# Patient Record
Sex: Male | Born: 1970 | Race: White | Hispanic: No | Marital: Married | Smoking: Never smoker
Health system: Southern US, Community
[De-identification: ages and names within clinical notes are randomized; demographics above are authoritative.]

---

## 2000-12-06 ENCOUNTER — Ambulatory Visit (HOSPITAL_BASED_OUTPATIENT_CLINIC_OR_DEPARTMENT_OTHER): Admission: RE | Admit: 2000-12-06 | Discharge: 2000-12-06 | Payer: Self-pay | Admitting: Surgery

## 2003-10-30 ENCOUNTER — Encounter: Admission: RE | Admit: 2003-10-30 | Discharge: 2003-10-30 | Payer: Self-pay | Admitting: Family Medicine

## 2003-11-29 ENCOUNTER — Ambulatory Visit (HOSPITAL_BASED_OUTPATIENT_CLINIC_OR_DEPARTMENT_OTHER): Admission: RE | Admit: 2003-11-29 | Discharge: 2003-11-29 | Payer: Self-pay | Admitting: Orthopedic Surgery

## 2016-04-25 ENCOUNTER — Encounter: Payer: Self-pay | Admitting: Pulmonary Disease

## 2016-04-25 ENCOUNTER — Ambulatory Visit (INDEPENDENT_AMBULATORY_CARE_PROVIDER_SITE_OTHER): Payer: BLUE CROSS/BLUE SHIELD | Admitting: Pulmonary Disease

## 2016-04-25 VITALS — BP 122/82 | HR 68 | Ht 70.0 in | Wt 249.0 lb

## 2016-04-25 DIAGNOSIS — E669 Obesity, unspecified: Secondary | ICD-10-CM | POA: Diagnosis not present

## 2016-04-25 DIAGNOSIS — G4700 Insomnia, unspecified: Secondary | ICD-10-CM | POA: Diagnosis not present

## 2016-04-25 DIAGNOSIS — G4733 Obstructive sleep apnea (adult) (pediatric): Secondary | ICD-10-CM

## 2016-04-25 NOTE — Progress Notes (Signed)
Subjective:    Patient ID: Stephen Moreno, male    DOB: 1971-11-28, 45 y.o.   MRN: [id number]  HPI   This is the case of Stephen Moreno, 45 y.o. Male, who was referred by Dr. Antony Haste in consultation regarding OSA.   As you very well know, patient was dxed with OSA in 2010. Lab study was done at [facility].  Unsure severity. Tried cpap for 2-3 months. Did NOT tolerate cpap.  Allegedlly was allergic to a lot of masks. Face was swollen. No other issues. It did not sound that he was having an allergic reaction. I think it was too much pressure which caused his face to swell up.  Is hypersomnia has persisted.Has snoring, gasping, choking, witnessed apneas. Hypersomnia affects functionality. 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 Can get sleepy in the afternoon. No abnormal behavior and sleep. He has several awakenings during the night, no apparent reason. He takes Ambien at night to keep him sleeping throughout the night.  ESS 10.   Review of Systems  Constitutional: Negative.  Negative for fever and unexpected weight change.  HENT: Negative for congestion, dental problem, ear pain, nosebleeds, postnasal drip, rhinorrhea, sinus pressure, sneezing, sore throat and trouble swallowing.   Eyes: Negative.  Negative for redness and itching.  Respiratory: Negative.  Negative for cough, chest tightness, shortness of breath and wheezing.   Cardiovascular: Negative.  Negative for palpitations and leg swelling.  Gastrointestinal: Negative.  Negative for nausea and vomiting.  Endocrine: Negative.   Genitourinary: Negative.  Negative for dysuria.  Musculoskeletal: Negative.  Negative for joint swelling.  Skin: Negative.  Negative for rash.  Allergic/Immunologic: Negative.   Neurological: Positive for headaches.  Hematological: Negative.  Does not bruise/bleed easily.  Psychiatric/Behavioral: Negative.  Negative for dysphoric mood. The  patient is not nervous/anxious.    No past medical history on file.  No medical issues except for high cholesterol.  No asthma, copd.  (-) DVT, CA No family history on file.  Parents are healthy. Mother had cervical CA.   No past surgical history on file.  (-) surgery.   Social History   Social History  . Marital Status: Married    Spouse Name: N/A  . Number of Children: N/A  . Years of Education: N/A   Occupational History  . Not on file.   Social History Main Topics  . Smoking status: Never Smoker   . Smokeless tobacco: Not on file  . Alcohol Use: Not on file  . Drug Use: Not on file  . Sexual Activity: Not on file   Other Topics Concern  . Not on file   Social History Narrative  . No narrative on file   Lives in [place]. Contractor/build houses.   No Known Allergies   No outpatient prescriptions prior to visit.   No facility-administered medications prior to visit.   Meds ordered this encounter  Medications  . gemfibrozil (LOPID) 600 MG tablet    Sig: Take 1 tablet by mouth 2 (two) times daily.  . pravastatin (PRAVACHOL) 80 MG tablet    Sig: Take 1 tablet by mouth at bedtime.  Marland Kitchen zolpidem (AMBIEN) 10 MG tablet    Sig: Take 1 tablet by mouth at bedtime as needed.  . sildenafil (VIAGRA) 100 MG tablet    Sig: Take 1 tablet by mouth as directed.           Objective:  Physical Exam   Vitals:  Filed Vitals:   04/25/16 1029  BP: 122/82  Pulse: 68  Height: 5\' 10"  (1.778 m)  Weight: 249 lb (112.946 kg)  SpO2: 93%    Constitutional/General:  Pleasant, well-nourished, well-developed, not in any distress,  Comfortably seating.  Well kempt  Body mass index is 35.73 kg/(m^2). Wt Readings from Last 3 Encounters:  04/25/16 249 lb (112.946 kg)    Neck circumference: 19 inches  HEENT: Pupils equal and reactive to light and accommodation. Anicteric sclerae. Normal nasal mucosa.   No oral  lesions,  mouth clear,  oropharynx clear, no postnasal  drip. (-) Oral thrush. No dental caries.  Airway - Mallampati class III. Short stout neck.   Neck: No masses. Midline trachea. No JVD, (-) LAD. (-) bruits appreciated.  Respiratory/Chest: Grossly normal chest. (-) deformity. (-) Accessory muscle use.  Symmetric expansion. (-) Tenderness on palpation.  Resonant on percussion.  Diminished BS on both lower lung zones. (-) wheezing, crackles, rhonchi (-) egophony  Cardiovascular: Regular rate and  rhythm, heart sounds normal, no murmur or gallops, no peripheral edema  Gastrointestinal:  Normal bowel sounds. Soft, non-tender. No hepatosplenomegaly.  (-) masses.   Musculoskeletal:  Normal muscle tone. Normal gait.   Extremities: Grossly normal. (-) clubbing, cyanosis.  (-) edema  Skin: (-) rash,lesions seen.   Neurological/Psychiatric : alert, oriented to time, place, person. Normal mood and affect           Assessment & Plan:  OSA (obstructive sleep apnea) Patient was dxed with OSA in 2010. [facility].  Unsure severity. Tried cpap for 2-3 months. Did NOT tolerate cpap.  Allegedlly was allergic to a lot of masks. Face was swollen. No other issues. It did not sound that he was having an allergic reaction. I think it was too much pressure which caused his face to swell up.  Is hypersomnia has persisted.Has snoring, gasping, choking, witnessed apneas. Hypersomnia affects functionality. 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 Can get sleepy in the afternoon. No abnormal behavior and sleep. He has several awakenings during the night, no apparent reason. He takes Ambien at night to keep him sleeping throughout the night.   1. I wanted to do an inlab study but pt is busy with work.  2. Plan for a HST. 3. He is "allergic" to masks and had a lot of facial swelling in 2010. Plan to try pt on a lower pressure, high EPR, may also try nasal pillows/nasal mask/or cloth  mask.  4. May need an inlab titration study. May need BiPaP.   Insomnia Pt with issues staying asleep likely 2/2 OSA. On ambien. Try to wean off once on cpap.  Try melatonin or suvorexant.   Obesity Weight reduction.     Thank you very much for letting me participate in this patient's care. Please do not hesitate to give me a call if you have any questions or concerns regarding the treatment plan.   Patient will follow up with me in mid August.     Pollie MeyerJ. Angelo A. de Dios, MD 04/25/2016   11:50 AM Pulmonary and Critical Care Medicine [facility] Pager: [phone] Office: [phone], Fax: [phone]

## 2016-04-25 NOTE — Assessment & Plan Note (Signed)
Pt with issues staying asleep likely 2/2 OSA. On ambien. Try to wean off once on cpap.  Try melatonin or suvorexant.

## 2016-04-25 NOTE — Assessment & Plan Note (Addendum)
Patient was dxed with OSA in 2010. [facility].  Unsure severity. Tried cpap for 2-3 months. Did NOT tolerate cpap.  Allegedlly was allergic to a lot of masks. Face was swollen. No other issues. It did not sound that he was having an allergic reaction. I think it was too much pressure which caused his face to swell up.  Is hypersomnia has persisted.Has snoring, gasping, choking, witnessed apneas. Hypersomnia affects functionality. 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 Can get sleepy in the afternoon. No abnormal behavior and sleep. He has several awakenings during the night, no apparent reason. He takes Ambien at night to keep him sleeping throughout the night.   1. I wanted to do an inlab study but pt is busy with work.  2. Plan for a HST. 3. He is "allergic" to masks and had a lot of facial swelling in 2010. Plan to try pt on a lower pressure, high EPR, may also try nasal pillows/nasal mask/or cloth mask.  4. May need an inlab titration study. May need BiPaP.

## 2016-04-25 NOTE — Patient Instructions (Signed)
1. We will schedule you for a home sleep study. 2. We will order you an autocpap 5-15 cm H2o after the study. 2. Give us a call if you are having issues with cpap.  Return to clinic in August.

## 2016-04-25 NOTE — Assessment & Plan Note (Signed)
Weight reduction 

## 2016-05-17 DIAGNOSIS — G4733 Obstructive sleep apnea (adult) (pediatric): Secondary | ICD-10-CM | POA: Diagnosis not present

## 2016-05-31 ENCOUNTER — Telehealth: Payer: Self-pay | Admitting: Pulmonary Disease

## 2016-05-31 DIAGNOSIS — G4733 Obstructive sleep apnea (adult) (pediatric): Secondary | ICD-10-CM

## 2016-05-31 NOTE — Telephone Encounter (Signed)
LMTCB for the pt  [facility]

## 2016-05-31 NOTE — Telephone Encounter (Signed)
See other msg dated today 

## 2016-05-31 NOTE — Telephone Encounter (Signed)
  Please call the pt and tell the pt the HOME SLEEP STUDY  showed OSA / did not show OSA.   Pt stops breathing 25   times an hour.   Please order autoCPAP 5-15 cm H2O. Patient will need a mask fitting session. Patient will need a 1 month download.   Patient needs to be seen by me (as much as possible) or any of the NPs/APPs  6-8 weeks after obtaining the cpap machine. Let me know if you receive this.   Thanks!   J. Alexis FrockAngelo A de Dios, MD 05/31/2016, 4:14 PM

## 2016-06-01 ENCOUNTER — Other Ambulatory Visit: Payer: Self-pay | Admitting: *Deleted

## 2016-06-01 DIAGNOSIS — G4733 Obstructive sleep apnea (adult) (pediatric): Secondary | ICD-10-CM

## 2016-06-01 NOTE — Telephone Encounter (Signed)
Pt returning call and can be reached @ same.[name] ° °

## 2016-06-01 NOTE — Telephone Encounter (Signed)
Spoke with pt. He is aware of results. Order has been placed. Nothing further was needed.

## 2016-06-07 ENCOUNTER — Telehealth: Payer: Self-pay | Admitting: Pulmonary Disease

## 2016-06-07 NOTE — Telephone Encounter (Signed)
Spoke with pt, I advised that [facility] per [facility].  Pt then became upset that the [facility] had changed from [facility] without anyone notifying him.  I apologized for the change of DME companies and offered to give [facility] the [facility] so he could follow up on the order of his cpap order, pt stated "I will find the number and hopefully he gets the cpap before I have a heart attack" and hung up the phone.  ATC pt back X2, line rang twice each time and then line was ended.  Will close encounter.

## 2016-06-28 ENCOUNTER — Telehealth: Payer: Self-pay | Admitting: Pulmonary Disease

## 2016-06-28 NOTE — Telephone Encounter (Signed)
Per [facility], pt does not have finances to pay for cpap. He wants to hold off on cpap for now.   AD

## 2016-08-06 ENCOUNTER — Ambulatory Visit (INDEPENDENT_AMBULATORY_CARE_PROVIDER_SITE_OTHER): Payer: BLUE CROSS/BLUE SHIELD | Admitting: Pulmonary Disease

## 2016-08-06 ENCOUNTER — Encounter: Payer: Self-pay | Admitting: Pulmonary Disease

## 2016-08-06 DIAGNOSIS — G4733 Obstructive sleep apnea (adult) (pediatric): Secondary | ICD-10-CM

## 2016-08-06 DIAGNOSIS — E669 Obesity, unspecified: Secondary | ICD-10-CM | POA: Diagnosis not present

## 2016-08-06 NOTE — Assessment & Plan Note (Addendum)
Patient was dxed with OSA in 2010. [facility].  Unsure severity. Tried cpap for 2-3 months. Did NOT tolerate cpap.  Allegedlly was allergic to a lot of masks. Face was swollen. No other issues. It did not sound that he was having an allergic reaction. I think it was too much pressure which caused his face to swell up.  Patient with worsening hypersomnia affecting functionality at work. 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  Had a home sleep study in May 2017. AHI of 25. Started auto CPAP on 07/07/2016. Feels better using it. More energy. Less sleepiness. Significantly improved. This time around, no mask issues. He currently has nasal pillows. Download the last month, 90%, AHI1.   We extensively discussed the importance of treating OSA and the need to use PAP therapy.   Continue with autocpap 5-15 cm water.    Patient was instructed to have mask, tubings, filter, reservoir cleaned at least once a week with soapy water.  Patient was instructed to call the office if he/she is having issues with the PAP device.    I advised patient to obtain sufficient amount of sleep --  7 to 8 hours at least in a 24 hr period.  Patient was advised to follow good sleep hygiene.  Patient was advised NOT to engage in activities requiring concentration and/or vigilance if he/she is and  sleepy.  Patient is NOT to drive if he/she is sleepy.

## 2016-08-06 NOTE — Assessment & Plan Note (Signed)
Weight reduction 

## 2016-08-06 NOTE — Patient Instructions (Signed)
  It was a pleasure taking care of you today!  Continue using your CPAP machine.   Please make sure you use your CPAP device everytime you sleep.  We will monitor the usage of your machine per your insurance requirement.  Your insurance company may take the machine from you if you are not using it regularly.   Please clean the mask, tubings, filter, water reservoir with soapy water every week.  Please use distilled water for the water reservoir.   Please call the office or your machine provider (DME company) if you are having issues with the device.   Return to clinic in 1 year    

## 2016-08-06 NOTE — Progress Notes (Signed)
Subjective:    Patient ID: Stephen Moreno, male    DOB: [birth year], 45 y.o.   MRN: [id number]  HPI   This is the case of Stephen Moreno, 45 y.o. Male, who was referred by Dr. Antony HasteMichael Badger in consultation regarding OSA.   As you very well know, patient was dxed with OSA in 2010. [facility].  Unsure severity. Tried cpap for 2-3 months. Did NOT tolerate cpap.  Allegedlly was allergic to a lot of masks. Face was swollen. No other issues. It did not sound that he was having an allergic reaction. I think it was too much pressure which caused his face to swell up.  Is hypersomnia has persisted.Has snoring, gasping, choking, witnessed apneas. Hypersomnia affects functionality. 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 Can get sleepy in the afternoon. No abnormal behavior and sleep. He has several awakenings during the night, no apparent reason. He takes Ambien at night to keep him sleeping throughout the night.  ESS 10.    ROV 08/06/16 Patient returns to the office as follow-up after starting CPAP. He had a home sleep study which showed his AHI was 25. He had issues with a start up eventually starting on July 15. Download the last month, 90% compliance, AHI of 1.3. Uses CPAP. Feels better using it. More energy. Less sleepiness. No issues. Significantly improved.  Review of Systems  Constitutional: Negative.  Negative for fever and unexpected weight change.  HENT: Negative for congestion, dental problem, ear pain, nosebleeds, postnasal drip, rhinorrhea, sinus pressure, sneezing, sore throat and trouble swallowing.   Eyes: Negative.  Negative for redness and itching.  Respiratory: Negative.  Negative for cough, chest tightness, shortness of breath and wheezing.   Cardiovascular: Negative.  Negative for palpitations and leg swelling.  Gastrointestinal: Negative.  Negative for nausea and vomiting.  Endocrine: Negative.     Genitourinary: Negative.  Negative for dysuria.  Musculoskeletal: Negative.  Negative for joint swelling.  Skin: Negative.  Negative for rash.  Allergic/Immunologic: Negative.   Neurological: Positive for headaches.  Hematological: Negative.  Does not bruise/bleed easily.  Psychiatric/Behavioral: Negative.  Negative for dysphoric mood. The patient is not nervous/anxious.       Objective:   Physical Exam   Vitals:  Vitals:   08/06/16 1606  BP: 118/84  Pulse: 77  SpO2: 97%  Weight: 253 lb (114.8 kg)  Height: 5\' 10"  (1.778 m)    Constitutional/General:  Pleasant, well-nourished, well-developed, not in any distress,  Comfortably seating.  Well kempt  Body mass index is 36.3 kg/m. Wt Readings from Last 3 Encounters:  08/06/16 253 lb (114.8 kg)  04/25/16 249 lb (112.9 kg)    Neck circumference: 19 inches  HEENT: Pupils equal and reactive to light and accommodation. Anicteric sclerae. Normal nasal mucosa.   No oral  lesions,  mouth clear,  oropharynx clear, no postnasal drip. (-) Oral thrush. No dental caries.  Airway - Mallampati class III. Short stout neck.   Neck: No masses. Midline trachea. No JVD, (-) LAD. (-) bruits appreciated.  Respiratory/Chest: Grossly normal chest. (-) deformity. (-) Accessory muscle use.  Symmetric expansion. (-) Tenderness on palpation.  Resonant on percussion.  Diminished BS on both lower lung zones. (-) wheezing, crackles, rhonchi (-) egophony  Cardiovascular: Regular rate and  rhythm, heart sounds normal, no murmur or gallops, no peripheral edema  Gastrointestinal:  Normal bowel sounds. Soft, non-tender. No hepatosplenomegaly.  (-)  masses.   Musculoskeletal:  Normal muscle tone. Normal gait.   Extremities: Grossly normal. (-) clubbing, cyanosis.  (-) edema  Skin: (-) rash,lesions seen.   Neurological/Psychiatric : alert, oriented to time, place, person. Normal mood and affect           Assessment & Plan:  OSA  (obstructive sleep apnea) Patient was dxed with OSA in 2010. [facility].  Unsure severity. Tried cpap for 2-3 months. Did NOT tolerate cpap.  Allegedlly was allergic to a lot of masks. Face was swollen. No other issues. It did not sound that he was having an allergic reaction. I think it was too much pressure which caused his face to swell up.  Patient with worsening hypersomnia affecting functionality at work. 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  Had a home sleep study in May 2017. AHI of 25. Started auto CPAP on 07/07/2016. Feels better using it. More energy. Less sleepiness. Significantly improved. This time around, no mask issues. He currently has nasal pillows. Download the last month, 90%, AHI1.   We extensively discussed the importance of treating OSA and the need to use PAP therapy.   Continue with autocpap 5-15 cm water.    Patient was instructed to have mask, tubings, filter, reservoir cleaned at least once a week with soapy water.  Patient was instructed to call the office if he/she is having issues with the PAP device.    I advised patient to obtain sufficient amount of sleep --  7 to 8 hours at least in a 24 hr period.  Patient was advised to follow good sleep hygiene.  Patient was advised NOT to engage in activities requiring concentration and/or vigilance if he/she is and  sleepy.  Patient is NOT to drive if he/she is sleepy.    Obesity Weight reduction.   Return to clinic in 1 year. Sooner if with issues.        Pollie MeyerJ. Angelo A. de Dios, MD 08/06/2016   4:34 PM Pulmonary and Critical Care Medicine [facility] Pager: [phone] Office: [phone], Fax: [phone]

## 2016-09-13 ENCOUNTER — Telehealth: Payer: Self-pay | Admitting: Pulmonary Disease

## 2016-09-13 NOTE — Telephone Encounter (Signed)
   Sheena:   CPAP DL last month : 45%90%, AHI 1.4.  Cont autocpap 5-15 cm water.  pls tell pt he is doing well.   Pollie MeyerJ. Angelo A de Dios, MD 09/13/2016, 9:14 AM [facility] Pager [phone] After 3 pm or if no answer, call [phone]

## 2016-09-17 NOTE — Telephone Encounter (Signed)
LMTCB

## 2016-09-27 NOTE — Telephone Encounter (Signed)
LMTCB

## 2016-10-04 NOTE — Telephone Encounter (Signed)
Pt notified of cpap DL results per Dr Christene Slatese Dios.  PT verbalized understanding.

## 2016-10-04 NOTE — Telephone Encounter (Signed)
3rd attempt to contact pt. LMTCB. Closing encounter per protocol.

## 2016-10-10 ENCOUNTER — Encounter: Payer: Self-pay | Admitting: Pulmonary Disease

## 2018-02-16 ENCOUNTER — Emergency Department (HOSPITAL_BASED_OUTPATIENT_CLINIC_OR_DEPARTMENT_OTHER): Payer: BLUE CROSS/BLUE SHIELD

## 2018-02-16 ENCOUNTER — Emergency Department (HOSPITAL_BASED_OUTPATIENT_CLINIC_OR_DEPARTMENT_OTHER)
Admission: EM | Admit: 2018-02-16 | Discharge: 2018-02-16 | Disposition: A | Discharge status: Home / Self Care | Payer: BLUE CROSS/BLUE SHIELD | Attending: Emergency Medicine | Admitting: Emergency Medicine

## 2018-02-16 ENCOUNTER — Other Ambulatory Visit: Payer: Self-pay

## 2018-02-16 ENCOUNTER — Encounter (HOSPITAL_BASED_OUTPATIENT_CLINIC_OR_DEPARTMENT_OTHER): Payer: Self-pay | Admitting: Emergency Medicine

## 2018-02-16 DIAGNOSIS — R109 Unspecified abdominal pain: Secondary | ICD-10-CM | POA: Diagnosis not present

## 2018-02-16 DIAGNOSIS — W11XXXA Fall on and from ladder, initial encounter: Secondary | ICD-10-CM | POA: Diagnosis not present

## 2018-02-16 DIAGNOSIS — Y998 Other external cause status: Secondary | ICD-10-CM | POA: Insufficient documentation

## 2018-02-16 DIAGNOSIS — R079 Chest pain, unspecified: Secondary | ICD-10-CM | POA: Diagnosis not present

## 2018-02-16 DIAGNOSIS — Z7902 Long term (current) use of antithrombotics/antiplatelets: Secondary | ICD-10-CM | POA: Insufficient documentation

## 2018-02-16 DIAGNOSIS — S42102A Fracture of unspecified part of scapula, left shoulder, initial encounter for closed fracture: Secondary | ICD-10-CM | POA: Diagnosis not present

## 2018-02-16 DIAGNOSIS — R102 Pelvic and perineal pain: Secondary | ICD-10-CM | POA: Insufficient documentation

## 2018-02-16 DIAGNOSIS — Z79899 Other long term (current) drug therapy: Secondary | ICD-10-CM | POA: Diagnosis not present

## 2018-02-16 DIAGNOSIS — S0181XA Laceration without foreign body of other part of head, initial encounter: Secondary | ICD-10-CM | POA: Diagnosis not present

## 2018-02-16 DIAGNOSIS — S0990XA Unspecified injury of head, initial encounter: Secondary | ICD-10-CM | POA: Diagnosis not present

## 2018-02-16 DIAGNOSIS — Z23 Encounter for immunization: Secondary | ICD-10-CM | POA: Insufficient documentation

## 2018-02-16 DIAGNOSIS — S4992XA Unspecified injury of left shoulder and upper arm, initial encounter: Secondary | ICD-10-CM | POA: Diagnosis present

## 2018-02-16 DIAGNOSIS — Y929 Unspecified place or not applicable: Secondary | ICD-10-CM | POA: Insufficient documentation

## 2018-02-16 DIAGNOSIS — Y9389 Activity, other specified: Secondary | ICD-10-CM | POA: Diagnosis not present

## 2018-02-16 LAB — BASIC METABOLIC PANEL
Anion gap: 7 (ref 5–15)
BUN: 15 mg/dL (ref 6–20)
CO2: 25 mmol/L (ref 22–32)
Calcium: 8.5 mg/dL — ABNORMAL LOW (ref 8.9–10.3)
Chloride: 104 mmol/L (ref 101–111)
Creatinine, Ser: 1.05 mg/dL (ref 0.61–1.24)
GFR calc Af Amer: >60 mL/min (ref >60)
GFR calc non Af Amer: >60 mL/min (ref >60)
Glucose, Bld: 124 mg/dL — ABNORMAL HIGH (ref 65–99)
Potassium: 3.9 mmol/L (ref 3.5–5.1)
Sodium: 136 mmol/L (ref 135–145)

## 2018-02-16 MED ORDER — OXYCODONE HCL 5 MG PO TABS: 5.0000 mg | ORAL_TABLET | ORAL | 0 refills | Status: AC | PRN

## 2018-02-16 MED ORDER — BACITRACIN ZINC 500 UNIT/GM EX OINT
TOPICAL_OINTMENT | Freq: Two times a day (BID) | CUTANEOUS | Status: DC
  Administered 2018-02-16: 1 via TOPICAL

## 2018-02-16 MED ORDER — HYDROMORPHONE HCL 1 MG/ML IJ SOLN
1.0000 mg | Freq: Once | INTRAMUSCULAR | Status: AC
  Administered 2018-02-16: 1 mg via INTRAVENOUS
  Filled 2018-02-16: qty 1

## 2018-02-16 MED ORDER — IOPAMIDOL (ISOVUE-300) INJECTION 61%
100.0000 mL | Freq: Once | INTRAVENOUS | Status: AC | PRN
  Administered 2018-02-16: 100 mL via INTRAVENOUS

## 2018-02-16 MED ORDER — SODIUM CHLORIDE 0.9 % IV BOLUS (SEPSIS)
1000.0000 mL | Freq: Once | INTRAVENOUS | Status: AC
  Administered 2018-02-16: 1000 mL via INTRAVENOUS

## 2018-02-16 MED ORDER — TETANUS-DIPHTH-ACELL PERTUSSIS 5-2.5-18.5 LF-MCG/0.5 IM SUSP
0.5000 mL | Freq: Once | INTRAMUSCULAR | Status: AC
  Administered 2018-02-16: 0.5 mL via INTRAMUSCULAR
  Filled 2018-02-16: qty 0.5

## 2018-02-16 MED ORDER — MORPHINE SULFATE (PF) 4 MG/ML IV SOLN
4.0000 mg | Freq: Once | INTRAVENOUS | Status: AC
  Administered 2018-02-16: 4 mg via INTRAVENOUS
  Filled 2018-02-16: qty 1

## 2018-02-16 NOTE — ED Notes (Signed)
Assisted pt to car in wheelchair. Pt and wife voiced understanding of d/c instructions.

## 2018-02-16 NOTE — ED Notes (Signed)
Alert, NAD, calm, interactive, resps e/u, speaking in clear complete sentences, no dyspnea noted, skin W&D, VSS, c/o L shoudler pain, (denies: sob, nausea, dizziness or visual changes). [facility]

## 2018-02-16 NOTE — ED Notes (Signed)
PMS intact before and after. Pt tolerated well. All questions answered. 

## 2018-02-16 NOTE — ED Notes (Signed)
Patient transported to CT and xray by wheelchair. Pt alert and oriented. [facility]

## 2018-02-16 NOTE — ED Notes (Signed)
Scalp abrasion cleansed and bacitracin applied. Pt given ice pack. Wife given something to drink.

## 2018-02-16 NOTE — ED Notes (Signed)
No changes. Alert, NAD, calm, interactive, no dyspnea, VSS, [facility] States, "pain worse than before CT".

## 2018-02-16 NOTE — ED Triage Notes (Signed)
Pt fell over 10 feet off of a ladder. C/o L shoulder pain, denies neck or back pain, denies LOC.

## 2018-02-16 NOTE — Discharge Instructions (Signed)
Roxi, tylenol, and motrin as needed for pain. Ice affected area (see instructions below).  Please call the orthopedic physician listed today or first thing in the morning to schedule a follow up appointment.   Fractures generally take 4-6 weeks to heal. It is very important to keep your splint dry until your follow up with the orthopedic doctor and a cast can be applied. You may place a plastic bag around the extremity with the splint while bathing to keep it dry. Also try to sleep with the extremity elevated for the next several nights to decrease swelling. Check the fingertips and toes several times per day to make sure they are not cold, pale, or blue. If this is the case, the splint may be too tight and should return to the ER, your regular doctor or the orthopedist for recheck. Return to the ER for new or worsening symptoms, any additional concerns.   COLD THERAPY DIRECTIONS:  Ice or gel packs can be used to reduce both pain and swelling. Ice is the most helpful within the first 24 to 48 hours after an injury or flareup from overusing a muscle or joint.  Ice is effective, has very few side effects, and is safe for most people to use.   If you expose your skin to cold temperatures for too long or without the proper protection, you can damage your skin or nerves. Watch for signs of skin damage due to cold.   HOME CARE INSTRUCTIONS  Follow these tips to use ice and cold packs safely.  Place a dry or damp towel between the ice and skin. A damp towel will cool the skin more quickly, so you may need to shorten the time that the ice is used.  For a more rapid response, add gentle compression to the ice.  Ice for no more than 10 to 20 minutes at a time. The bonier the area you are icing, the less time it will take to get the benefits of ice.  Check your skin after 5 minutes to make sure there are no signs of a poor response to cold or skin damage.  Rest 20 minutes or more in between uses.  Once your  skin is numb, you can end your treatment. You can test numbness by very lightly touching your skin. The touch should be so light that you do not see the skin dimple from the pressure of your fingertip. When using ice, most people will feel these normal sensations in this order: cold, burning, aching, and numbness.

## 2018-02-16 NOTE — ED Notes (Signed)
ED PA at [facility] 

## 2018-02-17 NOTE — ED Provider Notes (Signed)
[facility] EMERGENCY DEPARTMENT Provider Note   CSN: [id number] Arrival date & time: 02/16/18  1753     History   Chief Complaint Chief Complaint  Patient presents with  . Fall    HPI Stephen Moreno is a 47 y.o. male.  HPI 47 year old Caucasian male past medical history significant for obesity and sleep apnea presents the emergency department today with complaints of left shoulder pain after an approximately 10-15 foot fall from a ladder.  Patient states that he was on top of the 10 foot plus ladder when he lost his balance falling backwards landing onto his left shoulder.  States that the impact was to his left shoulder.  He does report that the latter then scraped the top of his head causing a small laceration.  Patient has not taken anything for the pain prior to arrival.  Range of motion and palpation makes the pain worse.  Patient denies any associated LOC.  Denies any associated neck pain, back pain, chest pain, abdominal pain, paresthesias, weakness.  Patient is unsure of his last tetanus shot.  Holding still improves the pain.  Patient has been ambulatory since the event. History reviewed. No pertinent past medical history.  Patient Active Problem List   Diagnosis Date Noted  . OSA (obstructive sleep apnea) 04/25/2016  . Insomnia 04/25/2016  . Obesity 04/25/2016    History reviewed. No pertinent surgical history.     Home Medications    Prior to Admission medications   Medication Sig Start Date End Date Taking? Authorizing Provider  gemfibrozil (LOPID) 600 MG tablet Take 1 tablet by mouth 2 (two) times daily. 02/28/16   [provider]  oxyCODONE (ROXICODONE) 5 MG immediate release tablet Take 1 tablet (5 mg total) by mouth every 4 (four) hours as needed for severe pain. 02/16/18   Rise Mu, PA-C  pravastatin (PRAVACHOL) 80 MG tablet Take 1 tablet by mouth at bedtime. 02/28/16   [provider]  sildenafil (VIAGRA) 100 MG tablet  Take 1 tablet by mouth as directed. 02/28/16   [provider]  zolpidem (AMBIEN) 10 MG tablet Take 1 tablet by mouth at bedtime as needed. 02/28/16   [provider]    Family History No family history on file.  Social History Social History   Tobacco Use  . Smoking status: Never Smoker  . Smokeless tobacco: Never Used  Substance Use Topics  . Alcohol use: Not on file  . Drug use: Not on file     Allergies   Patient has no known allergies.   Review of Systems Review of Systems  Constitutional: Negative for chills and fever.  Eyes: Negative for visual disturbance.  Respiratory: Negative for shortness of breath.   Cardiovascular: Negative for chest pain.  Gastrointestinal: Negative for abdominal pain, nausea and vomiting.  Musculoskeletal: Positive for arthralgias, gait problem, joint swelling and myalgias. Negative for back pain, neck pain and neck stiffness.  Skin: Positive for wound. Negative for color change.  Neurological: Negative for dizziness, syncope, weakness, light-headedness, numbness and headaches.     Physical Exam Updated Vital Signs BP (!) 142/83   Pulse 90   Temp 98.1 F (36.7 C) (Oral)   Resp 18   Ht 5\' 10"  (1.778 m)   Wt 99.8 kg (220 lb)   SpO2 98%   BMI 31.57 kg/m   Physical Exam  HENT:  Head:     Physical Exam  Constitutional: Pt is oriented to person, place, and time.  Appears well-developed and well-nourished. No distress.  HENT:  Head: Normocephalic.  Patient has small abrasion to the to the right frontal bone.  No open wound.  Bleeding controlled.  No significant signs of debris.  No foreign body noted.  No skull depression noted.  No raccoon eyes or battle sign. Ears: No bilateral hemotympanum. Nose: Nose normal. No septal hematoma. Mouth/Throat: Uvula is midline, oropharynx is clear and moist and mucous membranes are normal.  Eyes: Conjunctivae and EOM are normal. Pupils are equal, round, and reactive to light.    Neck: No spinous process tenderness and no muscular tenderness present. No rigidity. Normal range of motion present.  Full ROM without pain No midline cervical tenderness No crepitus, deformity or step-offs  No paraspinal tenderness  Cardiovascular: Normal rate, regular rhythm and intact distal pulses.   Pulses:      Radial pulses are 2+ on the right side, and 2+ on the left side.       Dorsalis pedis pulses are 2+ on the right side, and 2+ on the left side.       Posterior tibial pulses are 2+ on the right side, and 2+ on the left side.  Pulmonary/Chest: Effort normal and breath sounds normal. No accessory muscle usage. No respiratory distress. No decreased breath sounds. No wheezes. No rhonchi. No rales. Exhibits no tenderness and no bony tenderness.  No flail segment, crepitus or deformity Equal chest expansion  Abdominal: Soft. Normal appearance and bowel sounds are normal. There is no tenderness. There is no rigidity, no guarding and no CVA tenderness.  Abd soft and nontender  Musculoskeletal: Normal range of motion.       Thoracic back: Exhibits normal range of motion.       Lumbar back: Exhibits normal range of motion.  Full range of motion of the T-spine and L-spine No tenderness to palpation of the spinous processes of the T-spine or L-spine No crepitus, deformity or step-offs No  tenderness to palpation of the paraspinous muscles of the L-spine  With significant tenderness to the left shoulder.  No obvious deformity noted.  No ecchymosis or edema.  There is no abrasions.  Patient has limited range of motion left shoulder due to pain.  Radial pulses are 2+ bilaterally.  Sensation intact.  Brisk cap refill.  Full range of motion left elbow and left wrist without pain.  Axillary nerve sensation intact.  Patient does have good muscle tone. Lymphadenopathy:    Pt has no cervical adenopathy.  Neurological: Pt is alert and oriented to person, place, and time. Normal reflexes. No cranial  nerve deficit. GCS eye subscore is 4. GCS verbal subscore is 5. GCS motor subscore is 6.  Reflex Scores:      Bicep reflexes are 2+ on the right side and 2+ on the left side.      Brachioradialis reflexes are 2+ on the right side and 2+ on the left side.      Patellar reflexes are 2+ on the right side and 2+ on the left side.      Achilles reflexes are 2+ on the right side and 2+ on the left side. Speech is clear and goal oriented, follows commands Normal 5/5 strength in upper and lower extremities bilaterally including dorsiflexion and plantar flexion, strong and equal grip strength Sensation normal to light and sharp touch Moves extremities without ataxia, coordination intact Normal gait and balance No Clonus  Skin: Skin is warm and dry. No rash noted. Pt is not  diaphoretic. No erythema.  Psychiatric: Normal mood and affect.  Nursing note and vitals reviewed.     ED Treatments / Results  Labs (all labs ordered are listed, but only abnormal results are displayed) Labs Reviewed  BASIC METABOLIC PANEL - Abnormal; Notable for the following components:      Result Value   Glucose, Bld 124 (*)    Calcium 8.5 (*)    All other components within normal limits    EKG  EKG Interpretation None       Radiology Dg Chest 2 View  Result Date: 02/16/2018 CLINICAL DATA:  LEFT chest pain following follow-up ladder today. Initial encounter. EXAM: CHEST  2 VIEW COMPARISON:  None. FINDINGS: The cardiomediastinal silhouette is unremarkable. There is no evidence of focal airspace disease, pulmonary edema, suspicious pulmonary nodule/mass, pleural effusion, or pneumothorax. A fracture of the UPPER LEFT scapula is noted. IMPRESSION: 1. LEFT scapular fracture. Recommend dedicated views for further characterization. 2. No evidence of acute cardiopulmonary disease. Electronically Signed   By: Harmon PierJeffrey  Hu M.D.   On: 02/16/2018 19:09   Ct Head Wo Contrast  Result Date: 02/16/2018 CLINICAL DATA:  Fall  off ladder. EXAM: CT HEAD WITHOUT CONTRAST CT CERVICAL SPINE WITHOUT CONTRAST TECHNIQUE: Multidetector CT imaging of the head and cervical spine was performed following the standard protocol without intravenous contrast. Multiplanar CT image reconstructions of the cervical spine were also generated. COMPARISON:  No acute intracranial abnormality. Specifically, no hemorrhage, hydrocephalus, mass lesion, acute infarction, or significant intracranial injury. FINDINGS: CT HEAD FINDINGS Brain: No hyperdense vessel or unexpected calcification. Vascular: No hyperdense vessel or unexpected calcification. Skull: No acute calvarial abnormality. Sinuses/Orbits: Air-fluid level in the left maxillary sinus. Mastoid air cells are clear. Orbital soft tissues unremarkable. Other: None CT CERVICAL SPINE FINDINGS Alignment: Normal Skull base and vertebrae: No fracture Soft tissues and spinal canal: Prevertebral soft tissues are normal. No epidural or paraspinal hematoma. Disc levels: Degenerative disc disease changes at C5-6 with disc space narrowing and spurring. Upper chest: Negative Other: No acute findings IMPRESSION: No acute intracranial abnormality. Acute sinusitis in the left maxillary sinus. No acute bony abnormality in the cervical spine. Electronically Signed   By: Charlett NoseKevin  Dover M.D.   On: 02/16/2018 19:06   Ct Chest W Contrast  Result Date: 02/16/2018 CLINICAL DATA:  47 year old male with chest, abdominal and pelvic pain following fall from ladder. EXAM: CT CHEST, ABDOMEN, AND PELVIS WITH CONTRAST TECHNIQUE: Multidetector CT imaging of the chest, abdomen and pelvis was performed following the standard protocol during bolus administration of intravenous contrast. CONTRAST:  100mL ISOVUE-300 IOPAMIDOL (ISOVUE-300) INJECTION 61% COMPARISON:  None. FINDINGS: CT CHEST FINDINGS Cardiovascular: No significant vascular findings. Normal heart size. No thoracic aortic aneurysm or pericardial effusion. Mediastinum/Nodes: No  enlarged mediastinal, hilar, or axillary lymph nodes. Thyroid gland, trachea, and esophagus demonstrate no significant findings. Lungs/Pleura: Mild bibasilar atelectasis noted. No airspace disease, consolidation, mass, nodule, pleural effusion or pneumothorax. Musculoskeletal: A comminuted fracture of the upper and mid LEFT scapula noted, greatest involving the junction of the body and spine. No other acute bony abnormalities are identified. CT ABDOMEN PELVIS FINDINGS Hepatobiliary: The liver and gallbladder are unremarkable. No biliary dilatation. Pancreas: Unremarkable Spleen: Unremarkable Adrenals/Urinary Tract: The kidneys, adrenal glands and bladder are unremarkable. Stomach/Bowel: Stomach is within normal limits. Appendix appears normal. No evidence of bowel wall thickening, distention, or inflammatory changes. Vascular/Lymphatic: No significant vascular findings are present. No enlarged abdominal or pelvic lymph nodes. Reproductive: Prostate is unremarkable. Other: No ascites, focal collection  or pneumoperitoneum. Small bilateral inguinal hernias containing fat are present. Musculoskeletal: No acute or significant osseous findings. IMPRESSION: 1. Comminuted fracture of the upper and mid LEFT scapula. 2. Mild bibasilar atelectasis 3. No other acute abnormalities within the chest abdomen or pelvis. 4. Small bilateral inguinal hernias containing fat. Electronically Signed   By: Harmon Pier M.D.   On: 02/16/2018 21:46   Ct Cervical Spine Wo Contrast  Result Date: 02/16/2018 CLINICAL DATA:  Fall off ladder. EXAM: CT HEAD WITHOUT CONTRAST CT CERVICAL SPINE WITHOUT CONTRAST TECHNIQUE: Multidetector CT imaging of the head and cervical spine was performed following the standard protocol without intravenous contrast. Multiplanar CT image reconstructions of the cervical spine were also generated. COMPARISON:  No acute intracranial abnormality. Specifically, no hemorrhage, hydrocephalus, mass lesion, acute  infarction, or significant intracranial injury. FINDINGS: CT HEAD FINDINGS Brain: No hyperdense vessel or unexpected calcification. Vascular: No hyperdense vessel or unexpected calcification. Skull: No acute calvarial abnormality. Sinuses/Orbits: Air-fluid level in the left maxillary sinus. Mastoid air cells are clear. Orbital soft tissues unremarkable. Other: None CT CERVICAL SPINE FINDINGS Alignment: Normal Skull base and vertebrae: No fracture Soft tissues and spinal canal: Prevertebral soft tissues are normal. No epidural or paraspinal hematoma. Disc levels: Degenerative disc disease changes at C5-6 with disc space narrowing and spurring. Upper chest: Negative Other: No acute findings IMPRESSION: No acute intracranial abnormality. Acute sinusitis in the left maxillary sinus. No acute bony abnormality in the cervical spine. Electronically Signed   By: Charlett Nose M.D.   On: 02/16/2018 19:06   Ct Abdomen Pelvis W Contrast  Result Date: 02/16/2018 CLINICAL DATA:  47 year old male with chest, abdominal and pelvic pain following fall from ladder. EXAM: CT CHEST, ABDOMEN, AND PELVIS WITH CONTRAST TECHNIQUE: Multidetector CT imaging of the chest, abdomen and pelvis was performed following the standard protocol during bolus administration of intravenous contrast. CONTRAST:  ISOVUE-300 IOPAMIDOL (ISOVUE-300) INJECTION 61% COMPARISON:  None. FINDINGS: CT CHEST FINDINGS Cardiovascular: No significant vascular findings. Normal heart size. No thoracic aortic aneurysm or pericardial effusion. Mediastinum/Nodes: No enlarged mediastinal, hilar, or axillary lymph nodes. Thyroid gland, trachea, and esophagus demonstrate no significant findings. Lungs/Pleura: Mild bibasilar atelectasis noted. No airspace disease, consolidation, mass, nodule, pleural effusion or pneumothorax. Musculoskeletal: A comminuted fracture of the upper and mid LEFT scapula noted, greatest involving the junction of the body and spine. No other  acute bony abnormalities are identified. CT ABDOMEN PELVIS FINDINGS Hepatobiliary: The liver and gallbladder are unremarkable. No biliary dilatation. Pancreas: Unremarkable Spleen: Unremarkable Adrenals/Urinary Tract: The kidneys, adrenal glands and bladder are unremarkable. Stomach/Bowel: Stomach is within normal limits. Appendix appears normal. No evidence of bowel wall thickening, distention, or inflammatory changes. Vascular/Lymphatic: No significant vascular findings are present. No enlarged abdominal or pelvic lymph nodes. Reproductive: Prostate is unremarkable. Other: No ascites, focal collection or pneumoperitoneum. Small bilateral inguinal hernias containing fat are present. Musculoskeletal: No acute or significant osseous findings. IMPRESSION: 1. Comminuted fracture of the upper and mid LEFT scapula. 2. Mild bibasilar atelectasis 3. No other acute abnormalities within the chest abdomen or pelvis. 4. Small bilateral inguinal hernias containing fat. Electronically Signed   By: Harmon Pier M.D.   On: 02/16/2018 21:46   Ct Shoulder Left Wo Contrast  Result Date: 02/16/2018 CLINICAL DATA:  47 year old male with left scapula fracture seen on earlier CT. EXAM: CT OF THE UPPER LEFT EXTREMITY WITHOUT CONTRAST TECHNIQUE: Multidetector CT imaging of the upper left extremity was performed according to the standard protocol. COMPARISON:  Chest CT dated  02/16/2018 FINDINGS: Bones/Joint/Cartilage There are multiple fractures of the left scapula involving the body of the scapula and scapular spine as well as fracture of the junction of the spine and body. The visualized left humerus appears intact. The glenohumeral alignment is preserved. There is no significant joint effusion. Ligaments Suboptimally assessed by CT. Muscles and Tendons No acute muscular injury or hematoma. Soft tissues Induration of the soft tissues of the left side of the neck and supraclavicular region likely a small hematoma. IMPRESSION: Fractures  of the left scapula.  No dislocation. Electronically Signed   By: Elgie Collard M.D.   On: 02/16/2018 22:02   Dg Shoulder Left  Result Date: 02/16/2018 CLINICAL DATA:  10 foot fall from ladder.  Left shoulder pain. EXAM: LEFT SHOULDER - 2+ VIEW COMPARISON:  Left humerus radiographs of the same day. FINDINGS: There is no evidence of fracture or dislocation. There is no evidence of arthropathy or other focal bone abnormality. Soft tissues are unremarkable. IMPRESSION: Negative left shoulder radiographs. Electronically Signed   By: Marin Roberts M.D.   On: 02/16/2018 19:08   Dg Humerus Left  Result Date: 02/16/2018 CLINICAL DATA:  Fall off of ladder.  Left shoulder pain. EXAM: LEFT HUMERUS - 2+ VIEW COMPARISON:  02/16/2018 FINDINGS: Degenerative changes in the [facility] joint with joint space narrowing and spurring. Glenohumeral joint is maintained. No acute bony abnormality. Specifically, no fracture, subluxation, or dislocation. Soft tissues are intact. IMPRESSION: Degenerative changes in the left AC joint. No acute bony abnormality. Electronically Signed   By: Charlett Nose M.D.   On: 02/16/2018 19:07    Procedures Procedures (including critical care time)  Medications Ordered in ED Medications  Tdap (BOOSTRIX) injection 0.5 mL (0.5 mLs Intramuscular Given 02/16/18 1912)  morphine 4 MG/ML injection 4 mg (4 mg Intravenous Given 02/16/18 1911)  HYDROmorphone (DILAUDID) injection 1 mg (1 mg Intravenous Given 02/16/18 2003)  sodium chloride 0.9 % bolus 1,000 mL (0 mLs Intravenous Stopped 02/16/18 2218)  iopamidol (ISOVUE-300) 61 % injection 100 mL (100 mLs Intravenous Contrast Given 02/16/18 2109)  HYDROmorphone (DILAUDID) injection 1 mg (1 mg Intravenous Given 02/16/18 2226)     Initial Impression / Assessment and Plan / ED Course  I have reviewed the triage vital signs and the nursing notes.  Pertinent labs & imaging results that were available during my care of the patient were reviewed by me  and considered in my medical decision making (see chart for details).  Clinical Course as of Feb 17 1539  Sun Feb 16, 2018  4847  47 year old male fall off a ladder 10 feet in the air landing on his left shoulder presents with severe left shoulder pain.  He is neurovascularly intact distal and is got a soft abdomen.  By plain film and then CT imaging he is got a comminuted scapular fracture.  He is in a sling we have contacted orthopedics and he will likely be discharged to follow-up with them for further management.  [MB]    Clinical Course User Index [MB] Terrilee Files, MD    She presents to the ED after a fall off of a 10 foot plus ladder.  Patient landed onto his left shoulder.  Only complaint at this time is left shoulder pain.  Does have a small abrasion to his right frontal region.  Patient's tetanus shot was updated.  No signs of open laceration that would require suturing.  Patient's vital signs are reassuring.  He is not hypotensive or tachycardic.  Does not meet any level 2 or 1 trauma criteria.  CT of head and neck along with x-rays of left shoulder and chest were obtained.  X-rays did note a severe left scapular fracture.  Given the impact and mechanism of injury with a scapular fracture will obtain further CT imaging of chest and abdomen along with shoulder.  Discussed with Dr. Victorino Dike with orthopedics.  He recommends CT imaging, shoulder mobilization and follow-up in the office this week.  CT imaging shows no further signs of acute intrathoracic, intracranial, intra-abdominal trauma.  Comminuted left scapular fracture was again identified on the CT scan.  Patient's pain is been managed in the ED.  He does remain hemodynamically stable without any hypotensive or tachycardia.  Shoulder sling was applied.  Patient will be given pain medication and contact information for orthopedics for follow-up in outpatient setting.  Pt is hemodynamically stable, in NAD, & able to ambulate in  the ED. Evaluation does not show pathology that would require ongoing emergent intervention or inpatient treatment. I explained the diagnosis to the patient. Pain has been managed & has no complaints prior to dc. Pt is comfortable with above plan and is stable for discharge at this time. All questions were answered prior to disposition. Strict return precautions for f/u to the ED were discussed. Encouraged follow up with PCP.   Final Clinical Impressions(s) / ED Diagnoses   Final diagnoses:  Closed fracture of left scapula, unspecified part of scapula, initial encounter    ED Discharge Orders        Ordered    oxyCODONE (ROXICODONE) 5 MG immediate release tablet  Every 4 hours PRN     02/16/18 2224       Wallace Keller 02/17/18 1549    Terrilee Files, MD 02/17/18 1745

## 2018-11-06 IMAGING — CT CT ABD-PELV W/ CM
3 of 5 series · 15 of 46 positions shown, 17 images · IV contrast (iopamidol)
Comparison: None.

CLINICAL DATA: 46-year-old male with chest, abdominal and pelvic
pain following fall from ladder.

EXAM:
CT CHEST, ABDOMEN, AND PELVIS WITH CONTRAST
TECHNIQUE: Multidetector CT imaging of the chest, abdomen and pelvis was
performed following the standard protocol during bolus
administration of intravenous contrast.
CONTRAST:  100mL QQZHZQ-ZYY IOPAMIDOL (QQZHZQ-ZYY) INJECTION 61%

[Series 3: cap with 2 · axial · 0.83mm/px · z∈[-588,-22]mm · 10 of 139 slices shown, 12 images]
[im 13/139  soft-tissue]
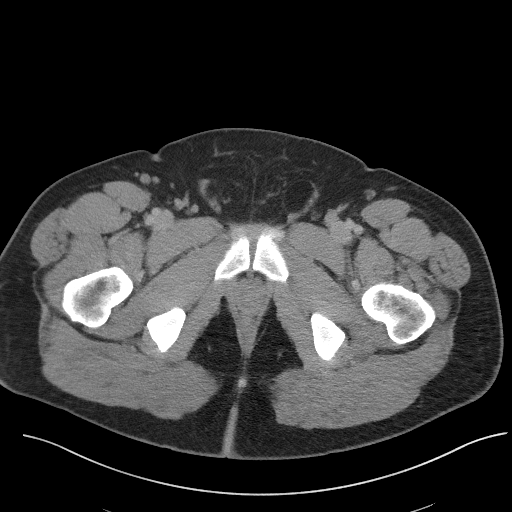
[im 13/139  bone]
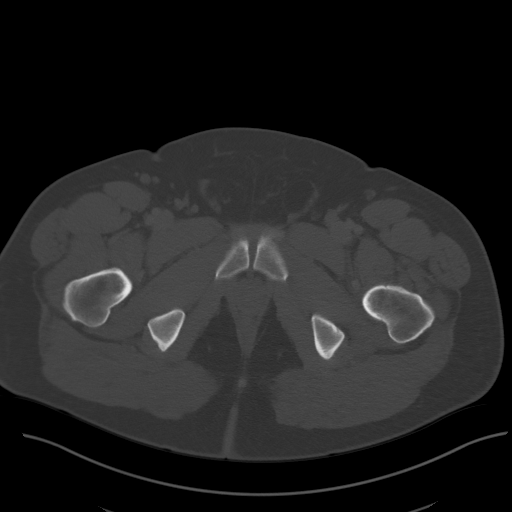
[im 26/139  soft-tissue]
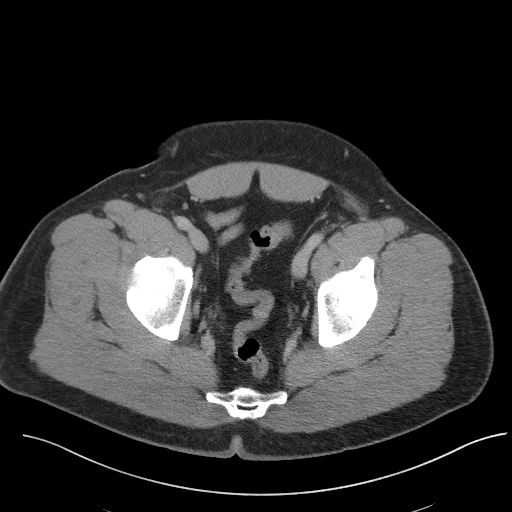
[im 38/139  soft-tissue]
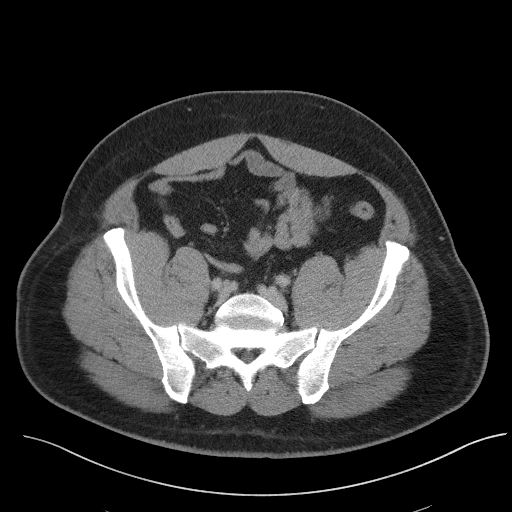
[im 51/139  soft-tissue]
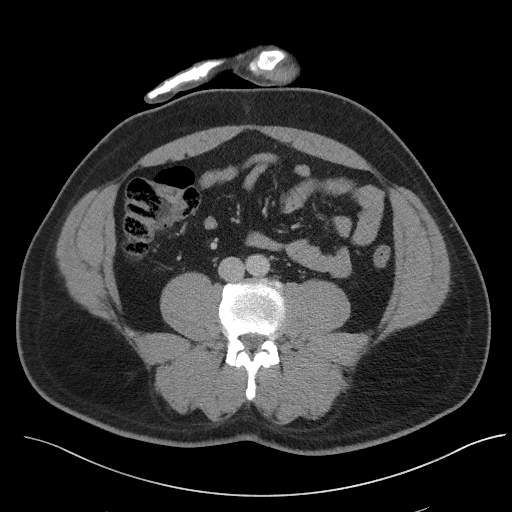
[im 63/139  soft-tissue]
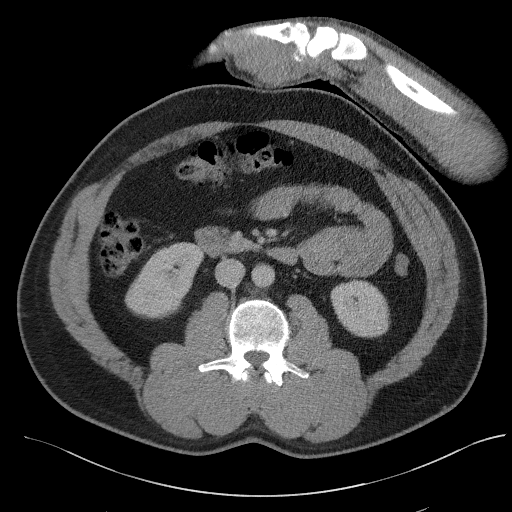
[im 76/139  soft-tissue]
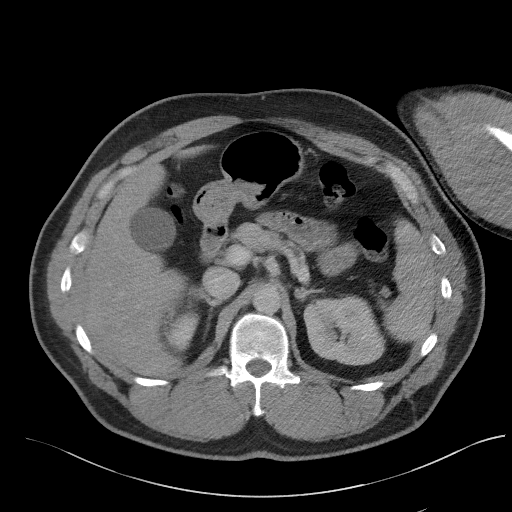
[im 88/139  soft-tissue]
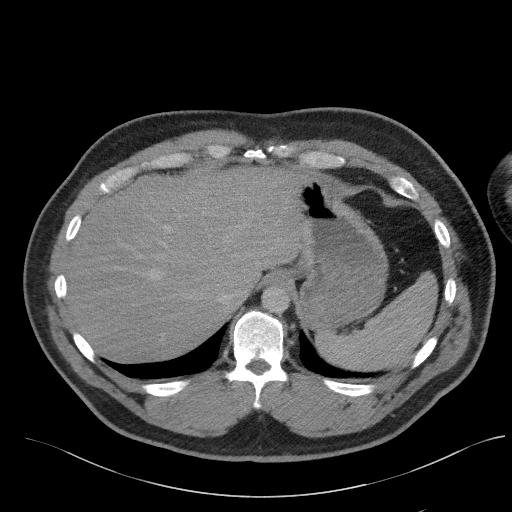
[im 101/139  soft-tissue]
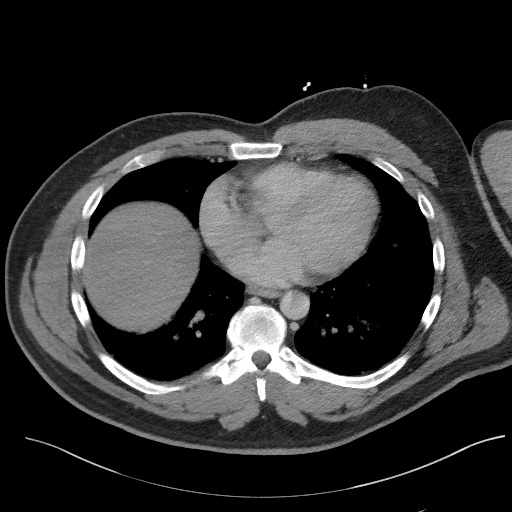
[im 113/139  soft-tissue]
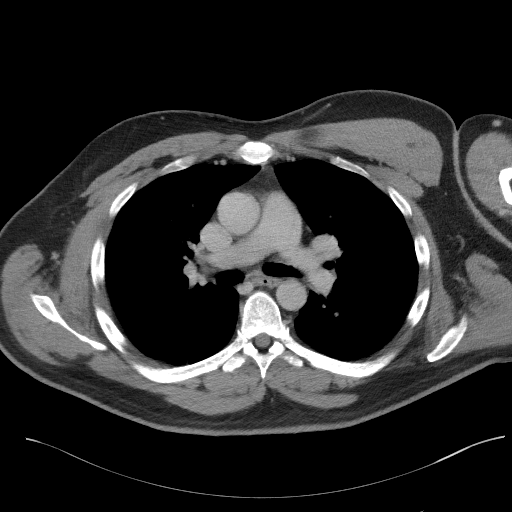
[im 113/139  bone]
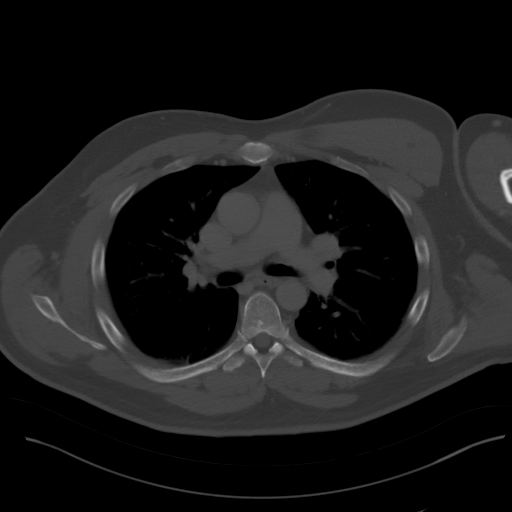
[im 126/139  soft-tissue]
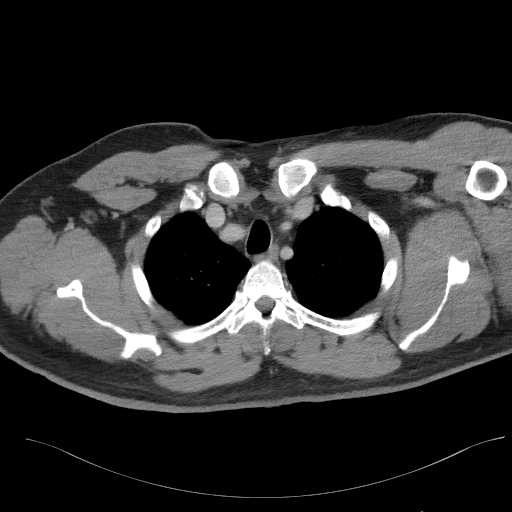

[Series 5: coronals · coronal · 0.90mm/px · 3 of 167 slices shown]
[im 56/167  soft-tissue]
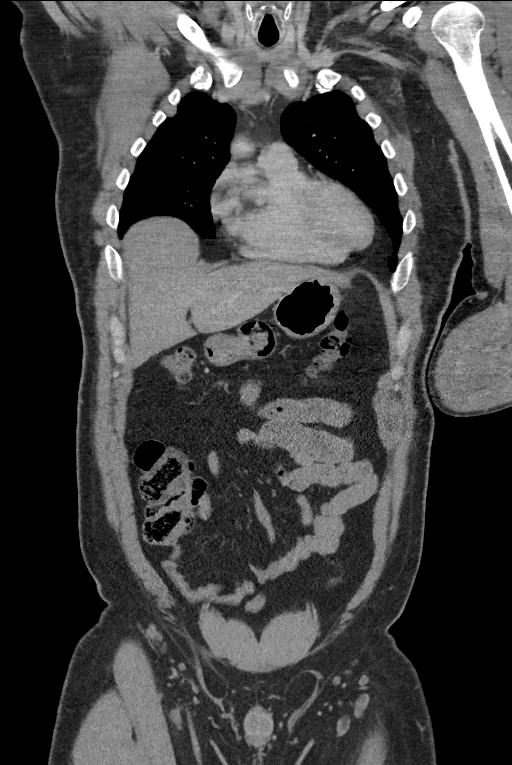
[im 74/167  soft-tissue]
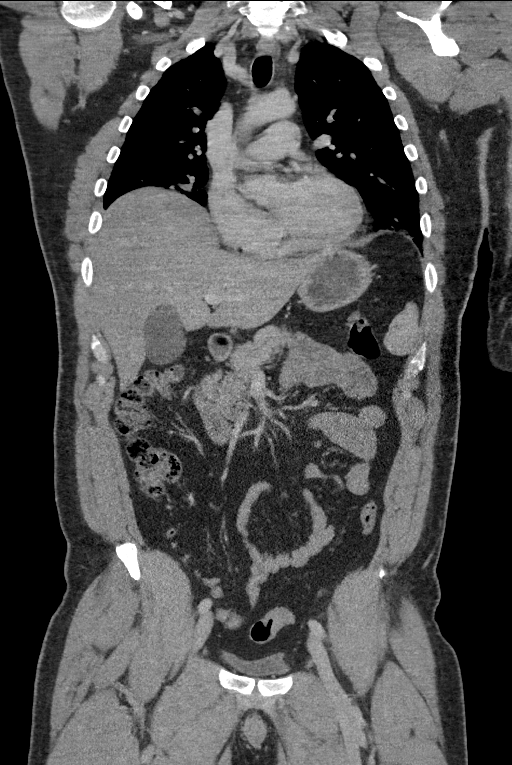
[im 93/167  soft-tissue]
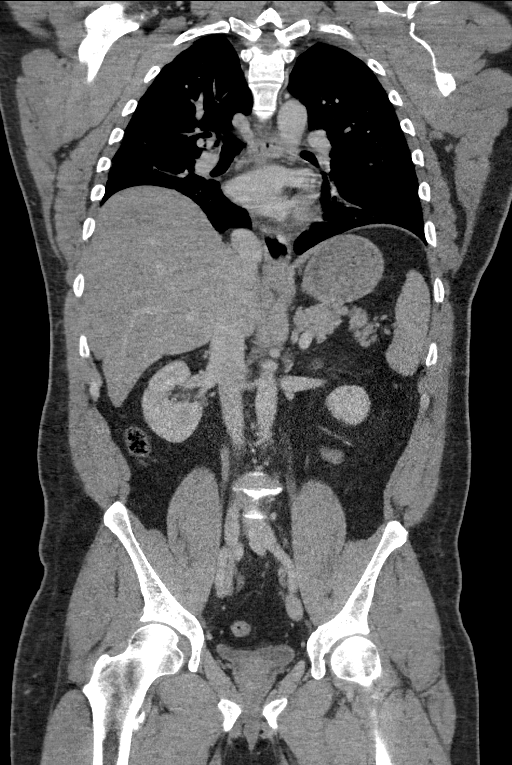

[Series 7: lung · axial · 0.71mm/px · z∈[-286,-240]mm · 2 of 173 slices shown]
[im 12/173  bone]
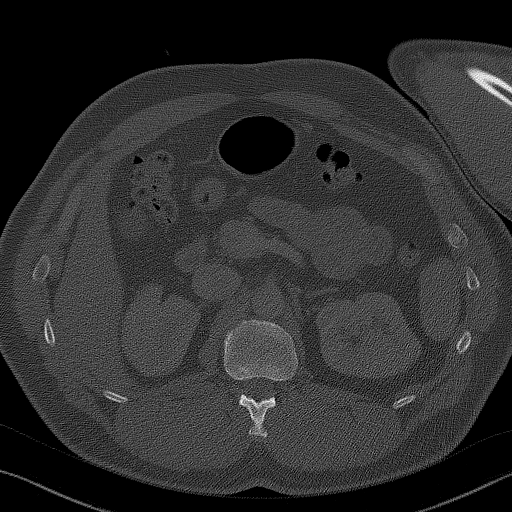
[im 35/173  bone]
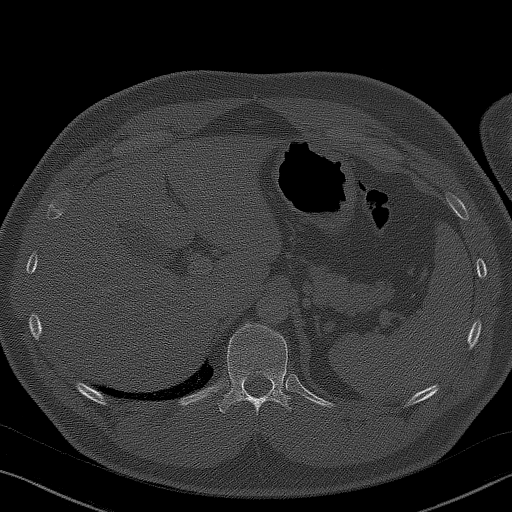

[15 of 46 positions shown; findings below may reference images not displayed]

FINDINGS: CT CHEST FINDINGS

Cardiovascular: No significant vascular findings. Normal heart size.
No thoracic aortic aneurysm or pericardial effusion.

Mediastinum/Nodes: No enlarged mediastinal, hilar, or axillary lymph
nodes. Thyroid gland, trachea, and esophagus demonstrate no
significant findings.

Lungs/Pleura: Mild bibasilar atelectasis noted. No airspace disease,
consolidation, mass, nodule, pleural effusion or pneumothorax.

Musculoskeletal: A comminuted fracture of the upper and mid LEFT
scapula noted, greatest involving the junction of the body and
spine. No other acute bony abnormalities are identified.

CT ABDOMEN PELVIS FINDINGS

Hepatobiliary: The liver and gallbladder are unremarkable. No
biliary dilatation.

Pancreas: Unremarkable

Spleen: Unremarkable

Adrenals/Urinary Tract: The kidneys, adrenal glands and bladder are
unremarkable.

Stomach/Bowel: Stomach is within normal limits. Appendix appears
normal. No evidence of bowel wall thickening, distention, or
inflammatory changes.

Vascular/Lymphatic: No significant vascular findings are present. No
enlarged abdominal or pelvic lymph nodes.

Reproductive: Prostate is unremarkable.

Other: No ascites, focal collection or pneumoperitoneum. Small
bilateral inguinal hernias containing fat are present.

Musculoskeletal: No acute or significant osseous findings.
IMPRESSION: 1. Comminuted fracture of the upper and mid LEFT scapula.
2. Mild bibasilar atelectasis
3. No other acute abnormalities within the chest abdomen or pelvis.
4. Small bilateral inguinal hernias containing fat.

## 2020-09-29 DIAGNOSIS — E782 Mixed hyperlipidemia: Secondary | ICD-10-CM | POA: Diagnosis not present

## 2020-09-29 DIAGNOSIS — Z0000 Encounter for general adult medical examination without abnormal findings: Secondary | ICD-10-CM | POA: Diagnosis not present

## 2020-09-29 DIAGNOSIS — Z791 Long term (current) use of non-steroidal anti-inflammatories (NSAID): Secondary | ICD-10-CM | POA: Diagnosis not present

## 2020-09-29 DIAGNOSIS — Z1211 Encounter for screening for malignant neoplasm of colon: Secondary | ICD-10-CM | POA: Diagnosis not present

## 2020-09-29 DIAGNOSIS — Z125 Encounter for screening for malignant neoplasm of prostate: Secondary | ICD-10-CM | POA: Diagnosis not present

## 2020-10-10 DIAGNOSIS — Z1211 Encounter for screening for malignant neoplasm of colon: Secondary | ICD-10-CM | POA: Diagnosis not present

## 2020-11-08 DIAGNOSIS — K635 Polyp of colon: Secondary | ICD-10-CM | POA: Diagnosis not present

## 2020-11-08 DIAGNOSIS — K5730 Diverticulosis of large intestine without perforation or abscess without bleeding: Secondary | ICD-10-CM | POA: Diagnosis not present

## 2020-11-08 DIAGNOSIS — Z1211 Encounter for screening for malignant neoplasm of colon: Secondary | ICD-10-CM | POA: Diagnosis not present

## 2020-11-08 DIAGNOSIS — D123 Benign neoplasm of transverse colon: Secondary | ICD-10-CM | POA: Diagnosis not present

## 2020-11-18 DIAGNOSIS — H109 Unspecified conjunctivitis: Secondary | ICD-10-CM | POA: Diagnosis not present

## 2020-11-18 DIAGNOSIS — S0502XA Injury of conjunctiva and corneal abrasion without foreign body, left eye, initial encounter: Secondary | ICD-10-CM | POA: Diagnosis not present

## 2020-12-28 DIAGNOSIS — R059 Cough, unspecified: Secondary | ICD-10-CM | POA: Diagnosis not present

## 2020-12-28 DIAGNOSIS — J0140 Acute pansinusitis, unspecified: Secondary | ICD-10-CM | POA: Diagnosis not present

## 2020-12-28 DIAGNOSIS — R0989 Other specified symptoms and signs involving the circulatory and respiratory systems: Secondary | ICD-10-CM | POA: Diagnosis not present

## 2021-03-31 DIAGNOSIS — Z791 Long term (current) use of non-steroidal anti-inflammatories (NSAID): Secondary | ICD-10-CM | POA: Diagnosis not present

## 2021-03-31 DIAGNOSIS — E782 Mixed hyperlipidemia: Secondary | ICD-10-CM | POA: Diagnosis not present

## 2021-03-31 DIAGNOSIS — F5101 Primary insomnia: Secondary | ICD-10-CM | POA: Diagnosis not present

## 2021-09-17 DIAGNOSIS — H9209 Otalgia, unspecified ear: Secondary | ICD-10-CM | POA: Diagnosis not present

## 2021-10-06 DIAGNOSIS — E782 Mixed hyperlipidemia: Secondary | ICD-10-CM | POA: Diagnosis not present

## 2021-10-06 DIAGNOSIS — Z0000 Encounter for general adult medical examination without abnormal findings: Secondary | ICD-10-CM | POA: Diagnosis not present

## 2021-10-06 DIAGNOSIS — Z791 Long term (current) use of non-steroidal anti-inflammatories (NSAID): Secondary | ICD-10-CM | POA: Diagnosis not present

## 2021-10-06 DIAGNOSIS — Z125 Encounter for screening for malignant neoplasm of prostate: Secondary | ICD-10-CM | POA: Diagnosis not present

## 2021-11-07 ENCOUNTER — Ambulatory Visit (INDEPENDENT_AMBULATORY_CARE_PROVIDER_SITE_OTHER): Payer: BC Managed Care – PPO | Admitting: Pulmonary Disease

## 2021-11-07 ENCOUNTER — Other Ambulatory Visit: Payer: Self-pay

## 2021-11-07 ENCOUNTER — Encounter: Payer: Self-pay | Admitting: Pulmonary Disease

## 2021-11-07 VITALS — BP 120/74 | HR 80 | Temp 98.3°F | Ht 70.0 in | Wt 259.0 lb

## 2021-11-07 DIAGNOSIS — G4733 Obstructive sleep apnea (adult) (pediatric): Secondary | ICD-10-CM

## 2021-11-07 NOTE — Progress Notes (Signed)
Stephen Moreno    [id number]    [birth year]  Primary Care Physician:Anderson, Rosey Bath, FNP  Referring Physician: Elizabeth Palau, FNP [address] [place],  [place] [address]  Chief complaint:   Patient with a history of obstructive sleep apnea -Intolerant of CPAP  HPI:  Patient with a history of obstructive sleep apnea -Initially diagnosed in 2010-tried CPAP for about 2 to 3 months-did not tolerate it -Developed a lot of facial swelling and irritation despite multiple different masks  Repeat home sleep study performed 2017-moderate obstructive sleep apnea  Recently with snoring, denies significant daytime sleepiness  Unable to tolerate CPAP, last use of CPAP was about 3 to 4 years ago  Usually goes to bed between 10 and 11, takes him a few minutes to fall asleep 1-2 awakenings Final wake up time about 530  Non-smoker  Outpatient Encounter Medications as of 11/07/2021  Medication Sig   gemfibrozil (LOPID) 600 MG tablet Take 1 tablet by mouth 2 (two) times daily.   oxyCODONE (ROXICODONE) 5 MG immediate release tablet Take 1 tablet (5 mg total) by mouth every 4 (four) hours as needed for severe pain.   pravastatin (PRAVACHOL) 80 MG tablet Take 1 tablet by mouth at bedtime.   sildenafil (VIAGRA) 100 MG tablet Take 1 tablet by mouth as directed.   zolpidem (AMBIEN) 10 MG tablet Take 1 tablet by mouth at bedtime as needed.   No facility-administered encounter medications on file as of 11/07/2021.    Allergies as of 11/07/2021   (No Known Allergies)    No past medical history on file.  No past surgical history on file.  No family history on file.  Social History   Socioeconomic History   Marital status: Married    Spouse name: Not on file   Number of children: Not on file   Years of education: Not on file   Highest education level: Not on file  Occupational History   Not on file  Tobacco Use   Smoking status: Never   Smokeless  tobacco: Never  Substance and Sexual Activity   Alcohol use: Not on file   Drug use: Not on file   Sexual activity: Not on file  Other Topics Concern   Not on file  Social History Narrative   Not on file   Social Determinants of Health   Financial Resource Strain: Not on file  Food Insecurity: Not on file  Transportation Needs: Not on file  Physical Activity: Not on file  Stress: Not on file  Social Connections: Not on file  Intimate Partner Violence: Not on file    Review of Systems  Constitutional:  Negative for fatigue.  Psychiatric/Behavioral:  Positive for sleep disturbance.    Vitals:   11/07/21 1600  BP: 120/74  Pulse: 80  Temp: 98.3 F (36.8 C)  SpO2: 97%     Physical Exam Constitutional:      Appearance: He is obese.  HENT:     Head: Normocephalic.     Mouth/Throat:     Mouth: Mucous membranes are moist.     Comments: Mallampati 3, crowded oropharynx, macroglossia Cardiovascular:     Rate and Rhythm: Normal rate and regular rhythm.     Heart sounds: No murmur heard.   No friction rub.  Pulmonary:     Effort: No respiratory distress.     Breath sounds: No stridor. No wheezing or rhonchi.  Musculoskeletal:  Cervical back: No rigidity or tenderness.  Neurological:     Mental Status: He is alert.  Psychiatric:        Mood and Affect: Mood normal.   Results of the Epworth flowsheet 11/07/2021  Sitting and reading 0  Watching TV 1  Sitting, inactive in a public place (e.g. a theatre or a meeting) 0  As a passenger in a car for an hour without a break 0  Lying down to rest in the afternoon when circumstances permit 1  Sitting and talking to someone 0  Sitting quietly after a lunch without alcohol 0  In a car, while stopped for a few minutes in traffic 0  Total score 2   Data Reviewed: Previous study reviewed showing moderate obstructive sleep apnea -Previous study did not reveal significant central apneic events  Assessment:  History of  obstructive sleep apnea intolerant of CPAP -Not able to tolerate multiple different interfaces that is tried over the years  Still has significant snoring, denies significant daytime sleepiness  Came in for discuss options of care  Pathophysiology of sleep disordered breathing discussed with the patient Treatment options discussed with the patient  Is interested in inspire device  Discussed his weight and weight restrictions, he is open to being referred for evaluation and encouraged to start weight loss efforts  Plan/Recommendations: Referral to ENT for evaluation for an inspire device  Encouraged weight loss efforts  Schedule patient for a home sleep study  Tentative follow-up in 4 months   Virl Diamond MD  Pulmonary and Critical Care 11/07/2021, 4:24 PM  CC: Elizabeth Palau, FNP

## 2021-11-07 NOTE — Patient Instructions (Signed)
Referral for evaluation for an inspire device  Work on weight loss measures -Ideally your weight should be about 230 to qualify  Call us with significant concerns  Since her last study was many years ago, we need a current study that still shows that you have significant sleep apnea  -We will schedule you for home sleep test  Tentative follow-up in the office in about 4 months
